# Patient Record
Sex: Male | Born: 1995 | Race: Black or African American | Hispanic: No | Marital: Single | Smoking: Never smoker
Health system: Southern US, Community
[De-identification: ages and names within clinical notes are randomized; demographics above are authoritative.]

## PROBLEM LIST (undated history)

## (undated) DIAGNOSIS — T7840XA Allergy, unspecified, initial encounter: Secondary | ICD-10-CM

## (undated) HISTORY — DX: Allergy, unspecified, initial encounter: T78.40XA

---

## 2016-04-05 ENCOUNTER — Encounter (HOSPITAL_COMMUNITY): Payer: Self-pay | Admitting: Emergency Medicine

## 2016-04-05 ENCOUNTER — Emergency Department (HOSPITAL_COMMUNITY)
Admission: EM | Admit: 2016-04-05 | Discharge: 2016-04-05 | Disposition: A | Discharge status: Home / Self Care | Payer: BLUE CROSS/BLUE SHIELD | Attending: Emergency Medicine | Admitting: Emergency Medicine

## 2016-04-05 DIAGNOSIS — R4182 Altered mental status, unspecified: Secondary | ICD-10-CM | POA: Diagnosis not present

## 2016-04-05 DIAGNOSIS — F10929 Alcohol use, unspecified with intoxication, unspecified: Secondary | ICD-10-CM

## 2016-04-05 DIAGNOSIS — F10129 Alcohol abuse with intoxication, unspecified: Secondary | ICD-10-CM | POA: Diagnosis present

## 2016-04-05 DIAGNOSIS — F1010 Alcohol abuse, uncomplicated: Secondary | ICD-10-CM

## 2016-04-05 LAB — RAPID URINE DRUG SCREEN, HOSP PERFORMED
Amphetamines: NOT DETECTED
BARBITURATES: NOT DETECTED
BENZODIAZEPINES: NOT DETECTED
COCAINE: NOT DETECTED
OPIATES: NOT DETECTED
Tetrahydrocannabinol: NOT DETECTED

## 2016-04-05 LAB — COMPREHENSIVE METABOLIC PANEL
ALK PHOS: 76 U/L (ref 38–126)
ALT: 18 U/L (ref 17–63)
ANION GAP: 9 (ref 5–15)
AST: 29 U/L (ref 15–41)
Albumin: 3.8 g/dL (ref 3.5–5.0)
BUN: 17 mg/dL (ref 6–20)
CALCIUM: 8.9 mg/dL (ref 8.9–10.3)
CHLORIDE: 109 mmol/L (ref 101–111)
CO2: 22 mmol/L (ref 22–32)
CREATININE: 1.04 mg/dL (ref 0.61–1.24)
Glucose, Bld: 109 mg/dL — ABNORMAL HIGH (ref 65–99)
Potassium: 3.4 mmol/L — ABNORMAL LOW (ref 3.5–5.1)
Sodium: 140 mmol/L (ref 135–145)
Total Bilirubin: 1 mg/dL (ref 0.3–1.2)
Total Protein: 6.5 g/dL (ref 6.5–8.1)

## 2016-04-05 LAB — CBC
HCT: 39.9 % (ref 39.0–52.0)
Hemoglobin: 13.3 g/dL (ref 13.0–17.0)
MCH: 30.3 pg (ref 26.0–34.0)
MCHC: 33.3 g/dL (ref 30.0–36.0)
MCV: 90.9 fL (ref 78.0–100.0)
PLATELETS: 202 10*3/uL (ref 150–400)
RBC: 4.39 MIL/uL (ref 4.22–5.81)
RDW: 13.5 % (ref 11.5–15.5)
WBC: 6 10*3/uL (ref 4.0–10.5)

## 2016-04-05 LAB — CBG MONITORING, ED: GLUCOSE-CAPILLARY: 86 mg/dL (ref 65–99)

## 2016-04-05 LAB — ETHANOL: ALCOHOL ETHYL (B): 163 mg/dL — AB (ref <5)

## 2016-04-05 MED ORDER — SODIUM CHLORIDE 0.9 % IV BOLUS (SEPSIS)
1000.0000 mL | Freq: Once | INTRAVENOUS | Status: AC
  Administered 2016-04-05: 1000 mL via INTRAVENOUS

## 2016-04-05 NOTE — ED Notes (Signed)
CHECKED CBG 86, DR Bebe ShaggyWICKLINE INFORMED

## 2016-04-05 NOTE — ED Provider Notes (Signed)
CSN: [id number]     Arrival date & time 04/05/16  0123 History  By signing my name below, I, Sean Gould, attest that this documentation has been prepared under the direction and in the presence of Sean Rhine, MD. Electronically Signed: Doreatha Gould, ED Scribe. 04/05/2016. 2:20 AM.    Chief Complaint  Patient presents with  . Altered Mental Status  . Alcohol Intoxication   LEVEL 5 CAVEAT: HPI and ROS limited due to alcohol intoxication    Patient is a 20 y.o. male presenting with intoxication. The history is provided by a friend. The history is limited by the condition of the patient. No language interpreter was used.  Alcohol Intoxication This is a new problem. The current episode started 1 to 2 hours ago. The problem occurs constantly. The problem has not changed since onset.Nothing aggravates the symptoms. Nothing relieves the symptoms. He has tried nothing for the symptoms. The treatment provided no relief.   HPI Comments: Sean Gould is a 20 y.o. male otherwise healthy who presents to the Emergency Department due to alcohol intoxication. Per friend, he and the pt were on the way to the party and the pt had been consuming large amounts of alcohol prior to this event. Friend reports that the pt began to appear heavily intoxicated, which caused his concern. Denies any other substance use. Friend denies falls or head injury. He states the pt drank approximately 5-8 shots of liquor. He reports the pt is not a heavy drinker.   PMH - none Social History  Substance Use Topics  . Smoking status: Unknown If Ever Smoked  . Smokeless tobacco: None  . Alcohol Use: None    Review of Systems  Reason unable to perform ROS: intoxication.   Allergies  Review of patient's allergies indicates no known allergies.  Home Medications   Prior to Admission medications   Not on File   BP 97/58 mmHg  Pulse 54  Temp(Src) 97.5 F (36.4 C) (Oral)  Resp 18  SpO2 94% Physical  Exam CONSTITUTIONAL: Well developed/well nourished HEAD: Normocephalic/atraumatic EYES: PERRL ENMT: Mucous membranes moist NECK: supple no meningeal signs SPINE/BACK:entire spine nontender CV: S1/S2 noted, no murmurs/rubs/gallops noted LUNGS: Lungs are clear to auscultation bilaterally, no apparent distress ABDOMEN: soft, nontender, no rebound or guarding, bowel sounds noted throughout abdomen GU:no cva tenderness NEURO: Pt is unresponsive, but will respond to pain  EXTREMITIES: pulses normal/equal, full ROM SKIN: warm, color normal PSYCH: unable to assess   ED Course  Procedures  Medications  sodium chloride 0.9 % bolus 1,000 mL (0 mLs Intravenous Stopped 04/05/16 0301)  sodium chloride 0.9 % bolus 1,000 mL (0 mLs Intravenous Stopped 04/05/16 0405)    DIAGNOSTIC STUDIES: Oxygen Saturation is 96% on RA, adequate by my interpretation.    COORDINATION OF CARE: 2:16 AM Discussed treatment plan with pt at bedside which includes lab work and pt agreed to plan.  3:04 AM Pt more arousable Continued to monitor 6:24 AM Pt awake/alert Taking PO Ambulatory Smiling, no distress He feels well for d/c home Discussed need to cut back on ETOH abuse  Labs Review Labs Reviewed  COMPREHENSIVE METABOLIC PANEL - Abnormal; Notable for the following:    Potassium 3.4 (*)    Glucose, Bld 109 (*)    All other components within normal limits  ETHANOL - Abnormal; Notable for the following:    Alcohol, Ethyl (B) 163 (*)    All other components within normal limits  CBC  URINE RAPID DRUG SCREEN, HOSP  PERFORMED  CBG MONITORING, ED    I have personally reviewed and evaluated these  lab results as part of my medical decision-making.   EKG Interpretation   Date/Time:  Sunday April 05 2016 02:15:01 EDT Ventricular Rate:  45 PR Interval:  209 QRS Duration: 96 QT Interval:  463 QTC Calculation: 400 R Axis:   89 Text Interpretation:  Sinus bradycardia Probable left ventricular  hypertrophy  ST elev, probable normal early repol pattern No previous ECGs  available Confirmed by Bebe ShaggyWICKLINE  MD, Anicia Leuthold ([id number]) on 04/05/2016 2:39:26  AM      MDM   Final diagnoses:  Alcohol abuse  Alcohol intoxication, with unspecified complication (HCC)    Nursing notes including past medical history and social history reviewed and considered in documentation Labs/vital reviewed myself and considered during evaluation    I personally performed the services described in this documentation, which was scribed in my presence. The recorded information has been reviewed and is accurate.      Sean Rhineonald Chonita Gadea, MD 04/05/16 [id number]

## 2016-04-05 NOTE — ED Notes (Signed)
[facility]; pt was found sitting against building unresponsive; friends called 911 and reports + ETOH intake but denies drugs; pupils 3; pt alert to pain only; EMS attempted to secure airway with NPA however patient did not tolerate; pt arrived with 15L o2 NRB and SP02 was 98%

## 2016-04-05 NOTE — ED Notes (Signed)
Paper scrubs provided for patients discharge

## 2016-04-05 NOTE — ED Notes (Signed)
Pt given sandwich and sprite; pt encouraged to wake up

## 2016-04-05 NOTE — Discharge Instructions (Signed)
Alcohol Intoxication  Alcohol intoxication occurs when you drink enough alcohol that it affects your ability to function. It can be mild or very severe. Drinking a lot of alcohol in a short time is called binge drinking. This can be very harmful. Drinking alcohol can also be more dangerous if you are taking medicines or other drugs. Some of the effects caused by alcohol may include:  · Loss of coordination.  · Changes in mood and behavior.  · Unclear thinking.  · Trouble talking (slurred speech).  · Throwing up (vomiting).  · Confusion.  · Slowed breathing.  · Twitching and shaking (seizures).  · Loss of consciousness.  HOME CARE  · Do not drive after drinking alcohol.  · Drink enough water and fluids to keep your pee (urine) clear or pale yellow. Avoid caffeine.  · Only take medicine as told by your doctor.  GET HELP IF:  · You throw up (vomit) many times.  · You do not feel better after a few days.  · You frequently have alcohol intoxication. Your doctor can help decide if you should see a substance use treatment counselor.  GET HELP RIGHT AWAY IF:  · You become shaky when you stop drinking.  · You have twitching and shaking.  · You throw up blood. It may look bright red or like coffee grounds.  · You notice blood in your poop (bowel movements).  · You become lightheaded or pass out (faint).  MAKE SURE YOU:   · Understand these instructions.  · Will watch your condition.  · Will get help right away if you are not doing well or get worse.     This information is not intended to replace advice given to you by your health care provider. Make sure you discuss any questions you have with your health care provider.     Document Released: 05/11/2008 Document Revised: 07/26/2013 Document Reviewed: 04/28/2013  Elsevier Interactive Patient Education ©2016 [employer]

## 2016-04-05 NOTE — ED Notes (Signed)
Dr Bebe ShaggyWickline in room with pt

## 2016-04-05 NOTE — ED Notes (Signed)
Pt ambulated with stand by assist to nurses station and back to room with steady gait; pt denies dizziness at this time

## 2019-02-01 ENCOUNTER — Encounter (HOSPITAL_COMMUNITY): Payer: Self-pay

## 2019-02-01 ENCOUNTER — Other Ambulatory Visit: Payer: Self-pay

## 2019-02-01 ENCOUNTER — Emergency Department (HOSPITAL_COMMUNITY)
Admission: EM | Admit: 2019-02-01 | Discharge: 2019-02-01 | Disposition: A | Discharge status: Home / Self Care | Payer: BC Managed Care – PPO | Attending: Emergency Medicine | Admitting: Emergency Medicine

## 2019-02-01 DIAGNOSIS — S0083XA Contusion of other part of head, initial encounter: Secondary | ICD-10-CM | POA: Insufficient documentation

## 2019-02-01 DIAGNOSIS — Y998 Other external cause status: Secondary | ICD-10-CM | POA: Insufficient documentation

## 2019-02-01 DIAGNOSIS — W010XXA Fall on same level from slipping, tripping and stumbling without subsequent striking against object, initial encounter: Secondary | ICD-10-CM | POA: Insufficient documentation

## 2019-02-01 DIAGNOSIS — Y9301 Activity, walking, marching and hiking: Secondary | ICD-10-CM | POA: Insufficient documentation

## 2019-02-01 DIAGNOSIS — Y9289 Other specified places as the place of occurrence of the external cause: Secondary | ICD-10-CM | POA: Insufficient documentation

## 2019-02-01 DIAGNOSIS — S0990XA Unspecified injury of head, initial encounter: Secondary | ICD-10-CM | POA: Diagnosis present

## 2019-02-01 NOTE — ED Notes (Signed)
Pt discharged from ED; instructions provided; Pt encouraged to return to ED if symptoms worsen and to f/u with PCP; Pt verbalized understanding of all instructions 

## 2019-02-01 NOTE — Discharge Instructions (Addendum)
Please read the attached information.  Please check your tetanus status if you have not had it within 5 years he will need to have it.  Return immediately if develop any new or worsening signs or symptoms.

## 2019-02-01 NOTE — ED Provider Notes (Signed)
[facility] EMERGENCY DEPARTMENT Provider Note   CSN: [id number] Arrival date & time: 02/01/19  4458    History   Chief Complaint Chief Complaint  Patient presents with  . Fall    HPI Sean Gould is a 23 y.o. male.     HPI   23 year old male presents status post fall.  Patient notes he was on a 12 mile hike today.  He tripped and fell landing on his hands and scraping the face.  He notes an abrasion to the forehead and in between his eyebrows.  He denies any loss of consciousness, denies any bloody nose, no neurological deficits, neck pain or pain in the hands.  Patient is uncertain when his last tetanus shot was but notes that they do have the records on file.   History reviewed. No pertinent past medical history.  There are no active problems to display for this patient.   History reviewed. No pertinent surgical history.      Home Medications    Prior to Admission medications   Not on File    Family History History reviewed. No pertinent family history.  Social History Social History   Tobacco Use  . Smoking status: Unknown If Ever Smoked  Substance Use Topics  . Alcohol use: Not on file  . Drug use: Not on file     Allergies   Patient has no known allergies.   Review of Systems Review of Systems  All other systems reviewed and are negative.    Physical Exam Updated Vital Signs BP 121/88 (BP Location: Right Arm)   Pulse 81   Temp 97.8 F (36.6 C) (Oral)   Resp 18   SpO2 99%   Physical Exam Vitals signs and nursing note reviewed.  Constitutional:      Appearance: He is well-developed.  HENT:     Head: Normocephalic and atraumatic.     Comments: Hematoma and superficial abrasion noted to the forehead, no surrounding tenderness or depressions, abrasion noted to the space in between the eyes, no tenderness along the nasal bridge, no septal hematoma, nares patent bilateral, remainder of face nontender to  palpation Eyes:     General: No scleral icterus.       Right eye: No discharge.        Left eye: No discharge.     Conjunctiva/sclera: Conjunctivae normal.     Pupils: Pupils are equal, round, and reactive to light.  Neck:     Musculoskeletal: Normal range of motion.     Vascular: No JVD.     Trachea: No tracheal deviation.  Pulmonary:     Effort: Pulmonary effort is normal.     Breath sounds: No stridor.  Musculoskeletal:     Comments: No CT or L-spine tenderness to palpation  Neurological:     Mental Status: He is alert and oriented to person, place, and time.     Coordination: Coordination normal.  Psychiatric:        Behavior: Behavior normal.        Thought Content: Thought content normal.        Judgment: Judgment normal.      ED Treatments / Results  Labs (all labs ordered are listed, but only abnormal results are displayed) Labs Reviewed - No data to display  EKG None  Radiology No results found.  Procedures Procedures (including critical care time)  Medications Ordered in ED Medications - No data to display   Initial Impression / Assessment and  Plan / ED Course  I have reviewed the triage vital signs and the nursing notes.  Pertinent labs & imaging results that were available during my care of the patient were reviewed by me and considered in my medical decision making (see chart for details).        23 year old male status post fall.  Patient has no line tenderness, no signs of skull fracture.  Patient is uncertain when his last tetanus was but notes to have it on file, I suspect he received this prior to college admission.  He will check this and have it updated if he has not had it within 5 years.  Given wound care instructions, strict return precautions.  Verbalized understanding and agreement to today's plan had no further questions or concerns.  Final Clinical Impressions(s) / ED Diagnoses   Final diagnoses:  Contusion of face, initial  encounter    ED Discharge Orders    None       Rosalio Loud 02/01/19 0858    Sabas Sous, MD 02/02/19 [id number]

## 2019-02-01 NOTE — ED Triage Notes (Signed)
Pt arrived private work vehicle; Pt was working and on trail with weight of approx 38lbs on his back, tripped and fell face first with no LOC

## 2019-02-15 ENCOUNTER — Ambulatory Visit: Payer: 59 | Admitting: Nurse Practitioner

## 2019-02-20 ENCOUNTER — Other Ambulatory Visit: Payer: Self-pay

## 2019-02-21 ENCOUNTER — Ambulatory Visit (INDEPENDENT_AMBULATORY_CARE_PROVIDER_SITE_OTHER): Payer: BLUE CROSS/BLUE SHIELD

## 2019-02-21 ENCOUNTER — Encounter: Payer: Self-pay | Admitting: Family Medicine

## 2019-02-21 ENCOUNTER — Ambulatory Visit: Payer: BLUE CROSS/BLUE SHIELD | Admitting: Family Medicine

## 2019-02-21 VITALS — BP 102/68 | HR 55 | Temp 98.0°F | Ht 73.25 in | Wt 159.0 lb

## 2019-02-21 DIAGNOSIS — Z0000 Encounter for general adult medical examination without abnormal findings: Secondary | ICD-10-CM | POA: Diagnosis not present

## 2019-02-21 DIAGNOSIS — M25562 Pain in left knee: Secondary | ICD-10-CM

## 2019-02-21 MED ORDER — DICLOFENAC SODIUM 1 % TD GEL: 4.0000 g | Freq: Four times a day (QID) | TRANSDERMAL | 1 refills | Status: AC

## 2019-02-21 NOTE — Progress Notes (Signed)
Sean Gould - 23 y.o. male MRN [id number]  Date of birth: Jan 03, 1996  Subjective Chief Complaint  Patient presents with   Establish Care    NP/ CPE, *fasting  , L lat knee pain     HPI Sean Gould is a 23 y.o. male here today for initial visit and annual exam.  He has been in good health without medical problems.  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  He has been having some mild lateral knee pain.  This has been going on for a few months.  He denies any known injury.  Worse with walking/jogging for longer periods of time.  He follows a healthy diet and is a non-smoker.    Review of Systems  Constitutional: Negative for chills, fever, malaise/fatigue and weight loss.  HENT: Negative for congestion, ear pain and sore throat.   Eyes: Negative for blurred vision, double vision and pain.  Respiratory: Negative for cough and shortness of breath.   Cardiovascular: Negative for chest pain and palpitations.  Gastrointestinal: Negative for abdominal pain, blood in stool, constipation, heartburn and nausea.  Genitourinary: Negative for dysuria and urgency.  Musculoskeletal: Positive for joint pain. Negative for myalgias.  Neurological: Negative for dizziness and headaches.  Endo/Heme/Allergies: Does not bruise/bleed easily.  Psychiatric/Behavioral: Negative for depression. The patient is not nervous/anxious and does not have insomnia.     Allergies  Allergen Reactions   Shellfish Allergy Anaphylaxis, Hives and Swelling    Past Medical History:  Diagnosis Date   Allergy     History reviewed. No pertinent surgical history.  Social History   Socioeconomic History   Marital status: Single    Spouse name: Not on file   Number of children: Not on file   Years of education: Not on file   Highest education level: Not on file  Occupational History   Not on file  Social Needs   Financial resource strain: Not on file   Food insecurity:    Worry: Not on file     Inability: Not on file   Transportation needs:    Medical: Not on file    Non-medical: Not on file  Tobacco Use   Smoking status: Never Smoker   Smokeless tobacco: Never Used  Substance and Sexual Activity   Alcohol use: Yes    Comment: socially    Drug use: Not Currently   Sexual activity: Yes    Birth control/protection: Condom  Lifestyle   Physical activity:    Days per week: Not on file    Minutes per session: Not on file   Stress: Not on file  Relationships   Social connections:    Talks on phone: Not on file    Gets together: Not on file    Attends religious service: Not on file    Active member of club or organization: Not on file    Attends meetings of clubs or organizations: Not on file    Relationship status: Not on file  Other Topics Concern   Not on file  Social History Narrative   Not on file    Family History  Problem Relation Age of Onset   Migraines Mother     Health Maintenance  Topic Date Due   HIV Screening  08/08/2012   TETANUS/TDAP  08/08/2016   INFLUENZA VACCINE  07/07/2018    ----------------------------------------------------------------------------------------------------------------------------------------------------------------------------------------------------------------- Physical Exam BP 102/68    Pulse (!) 55    Temp 98 F (36.7 C) (Oral)  Ht 6' 1.25" (1.861 m)    Wt 159 lb (72.1 kg)    SpO2 98%    BMI 20.83 kg/m   Physical Exam Constitutional:      General: He is not in acute distress. HENT:     Head: Normocephalic and atraumatic.     Right Ear: Tympanic membrane and external ear normal.     Left Ear: Tympanic membrane and external ear normal.     Mouth/Throat:     Mouth: Mucous membranes are moist.  Eyes:     General: No scleral icterus. Neck:     Musculoskeletal: Normal range of motion.     Thyroid: No thyromegaly.  Cardiovascular:     Rate and Rhythm: Normal rate and regular rhythm.     Heart  sounds: Normal heart sounds.  Pulmonary:     Effort: Pulmonary effort is normal.     Breath sounds: Normal breath sounds.  Abdominal:     General: Bowel sounds are normal. There is no distension.     Palpations: Abdomen is soft.     Tenderness: There is no abdominal tenderness. There is no guarding.  Musculoskeletal: Normal range of motion.     Comments: L knee with normal ROM.  No swelling/effusion noted.  Non tender.  No ligament laxity noted. Negative meniscal provocation testing.   Lymphadenopathy:     Cervical: No cervical adenopathy.  Skin:    General: Skin is warm and dry.     Findings: No rash.  Neurological:     General: No focal deficit present.     Mental Status: He is alert and oriented to person, place, and time.     Cranial Nerves: No cranial nerve deficit.     Motor: No abnormal muscle tone.  Psychiatric:        Mood and Affect: Mood normal.        Behavior: Behavior normal.     ------------------------------------------------------------------------------------------------------------------------------------------------------------------------------------------------------------------- Assessment and Plan  Lateral knee pain, left -Xrays ordered -Trial of voltaren gel to lateral knee  Well adult exam Well adult Orders Placed This Encounter  Procedures   DG Knee Complete 4 Views Left    Standing Status:   Future    Number of Occurrences:   1    Standing Expiration Date:   04/22/2020    Order Specific Question:   Reason for Exam (SYMPTOM  OR DIAGNOSIS REQUIRED)    Answer:   L knee pain    Order Specific Question:   Preferred imaging location?    Answer:   Internal    Order Specific Question:   Radiology Contrast Protocol - do NOT remove file path    Answer:   \[email or web address]  Screenings: HIV and labs completed at [facility] Immunizations:  Up to date Anticipatory guidance/Risk factor reduction:  Per AVS

## 2019-02-21 NOTE — Patient Instructions (Signed)

## 2019-02-23 DIAGNOSIS — M25562 Pain in left knee: Secondary | ICD-10-CM | POA: Insufficient documentation

## 2019-02-23 DIAGNOSIS — Z0000 Encounter for general adult medical examination without abnormal findings: Secondary | ICD-10-CM | POA: Insufficient documentation

## 2019-02-23 NOTE — Assessment & Plan Note (Signed)
-  Xrays ordered -Trial of voltaren gel to lateral knee

## 2019-02-23 NOTE — Assessment & Plan Note (Signed)
Well adult Orders Placed This Encounter  Procedures  . DG Knee Complete 4 Views Left    Standing Status:   Future    Number of Occurrences:   1    Standing Expiration Date:   04/22/2020    Order Specific Question:   Reason for Exam (SYMPTOM  OR DIAGNOSIS REQUIRED)    Answer:   L knee pain    Order Specific Question:   Preferred imaging location?    Answer:   Internal    Order Specific Question:   Radiology Contrast Protocol - do NOT remove file path    Answer:   \\[email or web address].[email or web address]  Screenings: HIV and labs completed at [facility] Immunizations:  Up to date Anticipatory guidance/Risk factor reduction:  Per AVS

## 2019-02-27 ENCOUNTER — Telehealth: Payer: Self-pay

## 2019-02-27 NOTE — Telephone Encounter (Signed)
Calle3d Pt , gave results and MyChart activation. Call completed.

## 2019-02-27 NOTE — Progress Notes (Signed)
Called Pt. Gave results and MyChart information for activation. Pt is aware of results and Tx. Call completed.

## 2019-02-27 NOTE — Telephone Encounter (Signed)
-----   Message from Everrett Coombe, DO sent at 02/27/2019  8:00 AM EDT ----- Xrays look ok.  Recommend continuing to use topical anti-inflammatory and recommend trying compression knee sleeve.

## 2019-02-27 NOTE — Progress Notes (Signed)
Xrays look ok.  Recommend continuing to use topical anti-inflammatory and recommend trying compression knee sleeve.

## 2020-02-13 IMAGING — DX LEFT KNEE - COMPLETE 4+ VIEW
4 series · 4 of 4 positions shown · non-contrast
Comparison: None.

CLINICAL DATA: 22-year-old male with left knee pain since December 2018. Lateral pain when running. Initial encounter.

EXAM:
LEFT KNEE - COMPLETE 4+ VIEW

[knee ap]
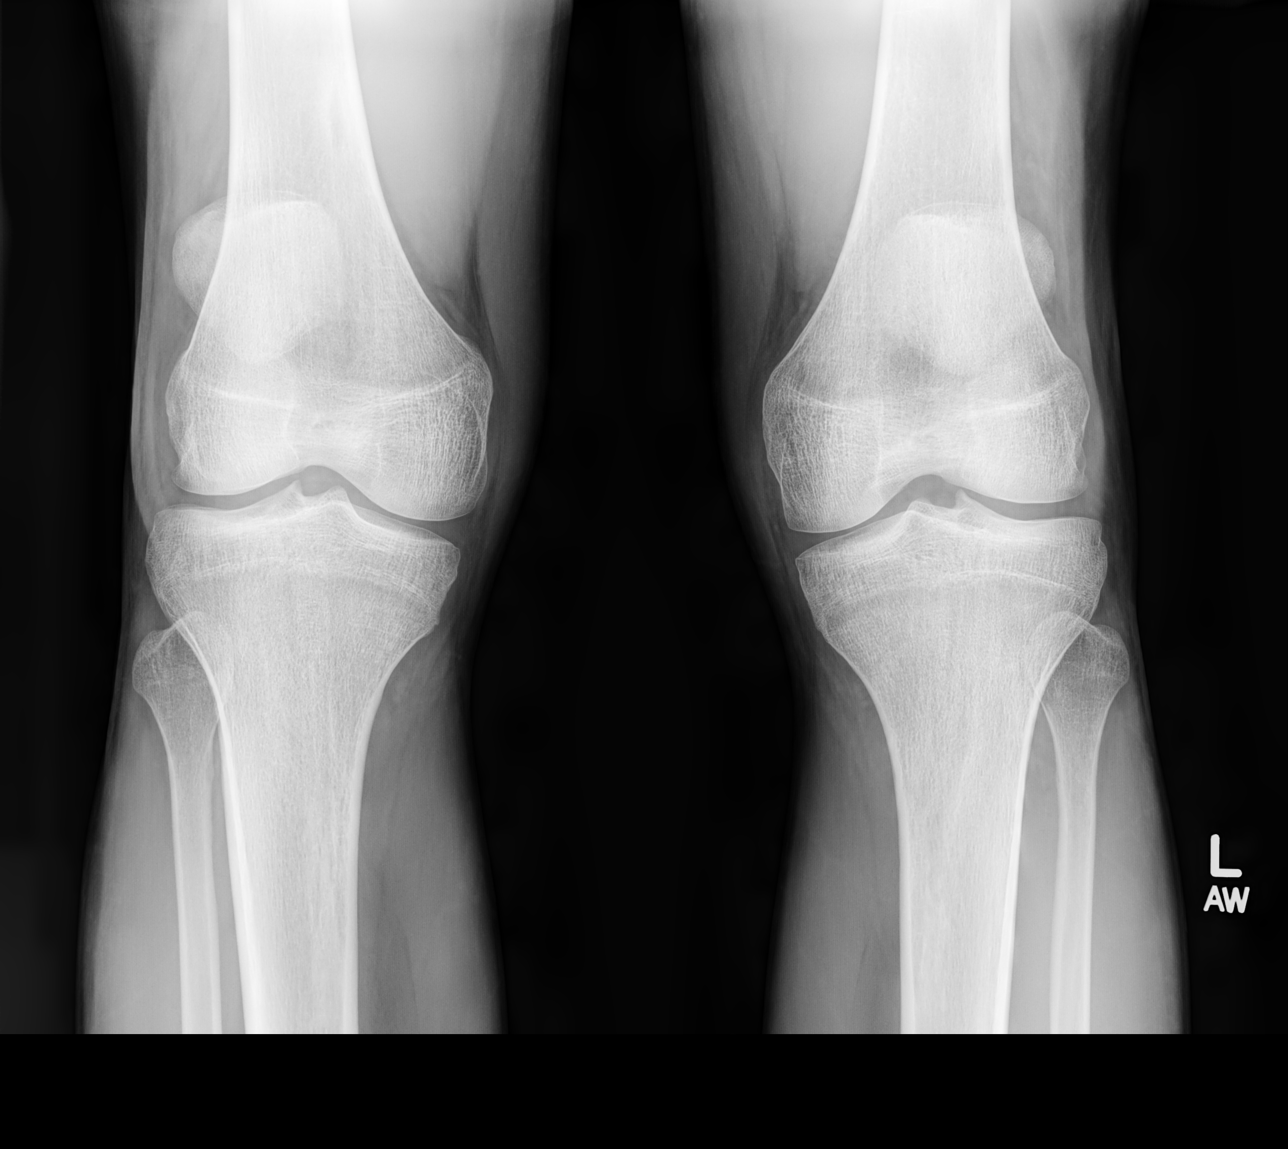

[knee [person_name] view pa]
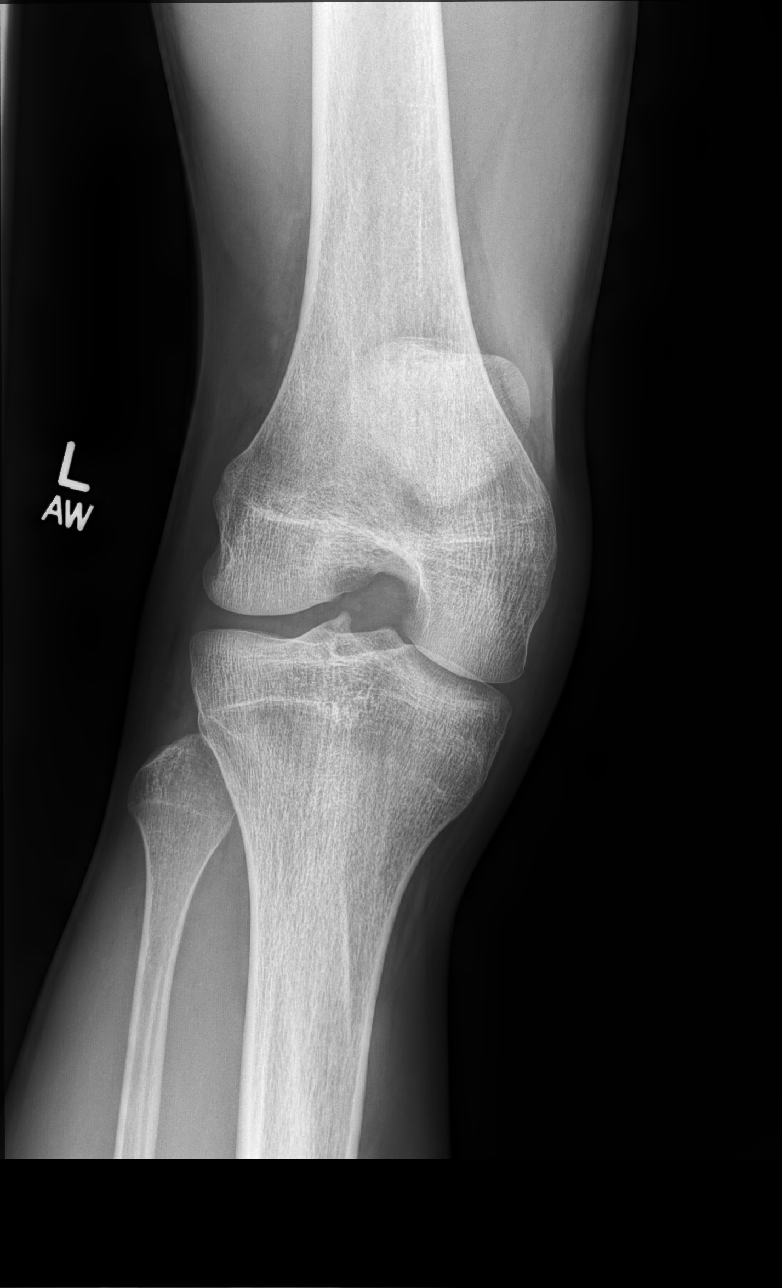

[knee lat]
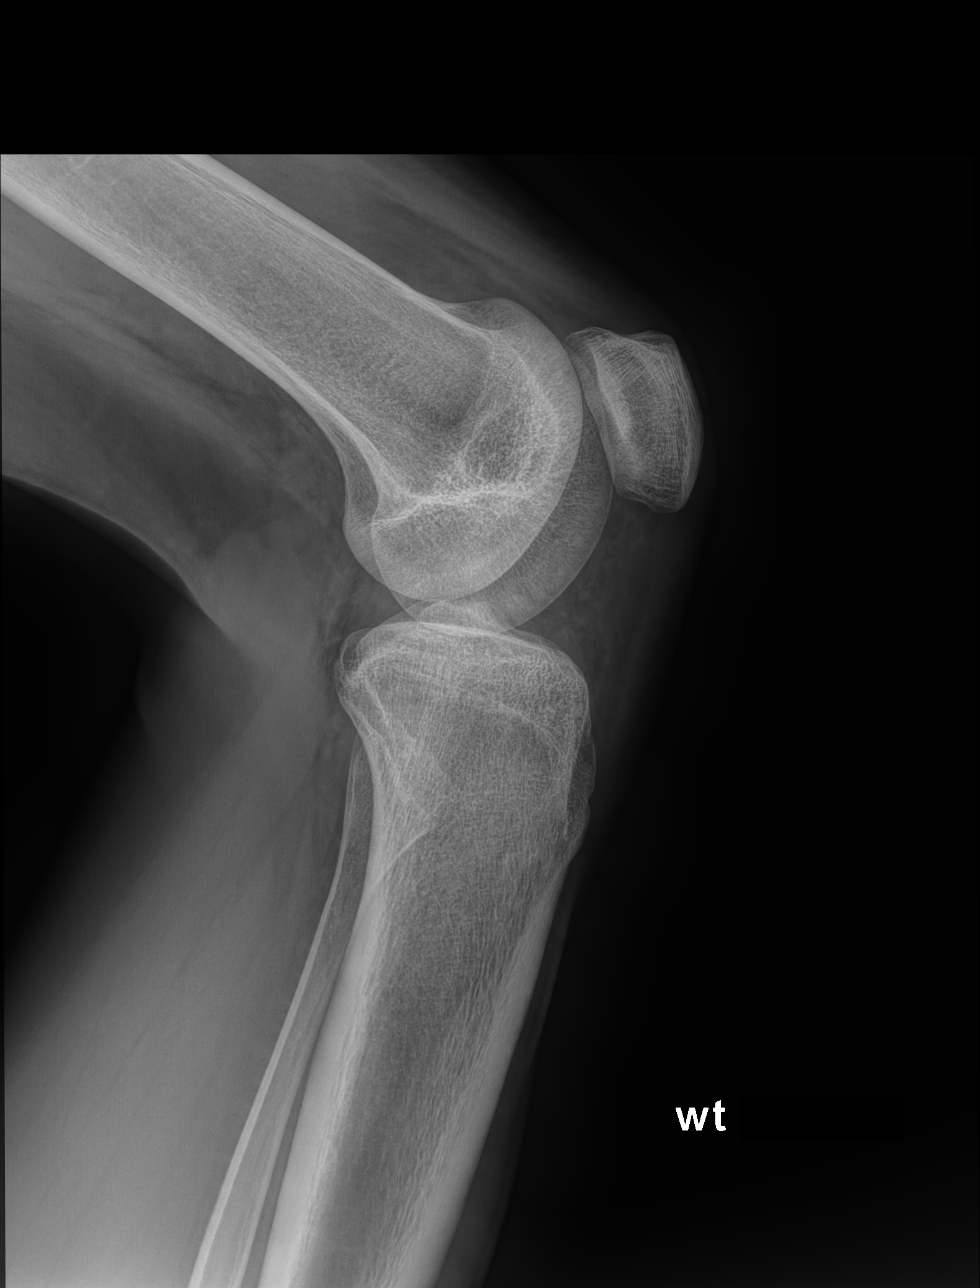

[patella (sunrise)]
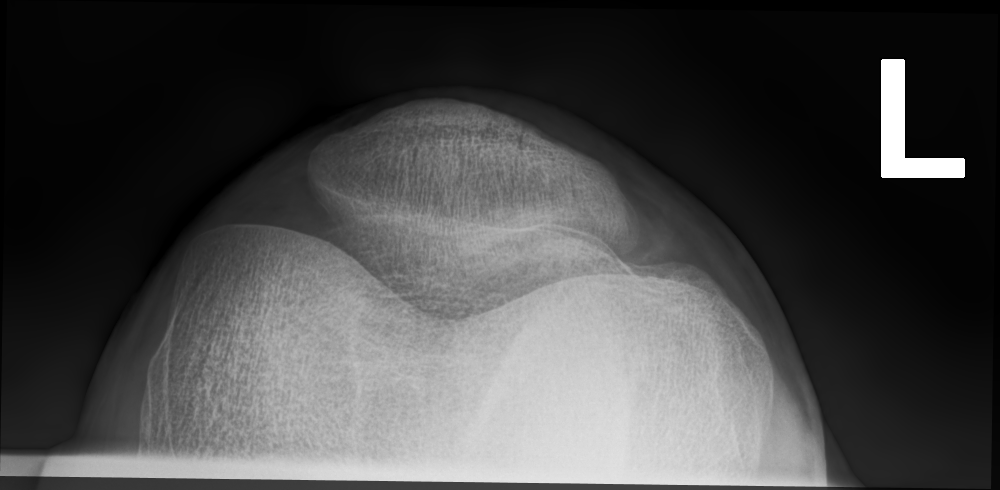

[4 of 4 positions shown; findings below may reference images not displayed]

FINDINGS: Mild patella Alta bilaterally. No significant joint space narrowing.
No fracture or dislocation. No joint effusion. No worrisome bony
lesion.
IMPRESSION: Mild patella Alta bilaterally.

## 2020-02-15 ENCOUNTER — Ambulatory Visit: Payer: BC Managed Care – PPO | Attending: Family

## 2020-02-15 DIAGNOSIS — Z23 Encounter for immunization: Secondary | ICD-10-CM

## 2020-02-15 NOTE — Progress Notes (Signed)
   Covid-19 Vaccination Clinic  Name:  Sean Gould    MRN: [id number] DOB: 1996-06-27  02/15/2020  Mr. Fill was observed post Covid-19 immunization for 15 minutes without incident. He was provided with Vaccine Information Sheet and instruction to access the V-Safe system.   Mr. Dingee was instructed to call 911 with any severe reactions post vaccine: Marland Kitchen Difficulty breathing  . Swelling of face and throat  . A fast heartbeat  . A bad rash all over body  . Dizziness and weakness   Immunizations Administered    Name Date Dose VIS Date Route   Moderna COVID-19 Vaccine 02/15/2020 10:55 AM 0.5 mL 11/07/2019 Intramuscular   Manufacturer: Moderna   Lot: [id number]   NDC: 22179-810-25

## 2020-03-19 ENCOUNTER — Ambulatory Visit: Payer: BC Managed Care – PPO | Attending: Family

## 2020-03-19 DIAGNOSIS — Z23 Encounter for immunization: Secondary | ICD-10-CM

## 2020-03-19 NOTE — Progress Notes (Signed)
   Covid-19 Vaccination Clinic  Name:  Kit Mollett    MRN: [id number] DOB: 1996/07/04  03/19/2020  Mr. Talerico was observed post Covid-19 immunization for 15 minutes without incident. He was provided with Vaccine Information Sheet and instruction to access the V-Safe system.   Mr. Porreca was instructed to call 911 with any severe reactions post vaccine: Marland Kitchen Difficulty breathing  . Swelling of face and throat  . A fast heartbeat  . A bad rash all over body  . Dizziness and weakness   Immunizations Administered    Name Date Dose VIS Date Route   Moderna COVID-19 Vaccine 03/19/2020 12:57 PM 0.5 mL 11/07/2019 Intramuscular   Manufacturer: Moderna   Lot: [id number]   NDC: 56153-794-32

## 2020-12-25 ENCOUNTER — Ambulatory Visit: Payer: BC Managed Care – PPO | Attending: Family

## 2020-12-25 DIAGNOSIS — Z23 Encounter for immunization: Secondary | ICD-10-CM

## 2021-04-21 NOTE — Progress Notes (Signed)
   Covid-19 Vaccination Clinic  Name:  Sean Gould    MRN: [id number] DOB: 10-11-1996  04/21/2021  [name] was observed post Covid-19 immunization for 15 minutes without incident. He was provided with Vaccine Information Sheet and instruction to access the V-Safe system.   [name] was instructed to call 911 with any severe reactions post vaccine: Marland Kitchen Difficulty breathing  . Swelling of face and throat  . A fast heartbeat  . A bad rash all over body  . Dizziness and weakness   Immunizations Administered    Name Date Dose VIS Date Route   Moderna Covid-19 Booster Vaccine 12/25/2020  1:15 PM 0.25 mL 09/25/2020 Intramuscular   Manufacturer: Moderna   Lot: [id number]   NDC: 57903-833-38
